# Patient Record
Sex: Male | Born: 1955 | Race: White | Hispanic: No | Marital: Single | Smoking: Current every day smoker
Health system: Southern US, Community
[De-identification: ages and names within clinical notes are randomized; demographics above are authoritative.]

---

## 2013-07-16 ENCOUNTER — Emergency Department (HOSPITAL_COMMUNITY)
Admission: EM | Admit: 2013-07-16 | Discharge: 2013-07-16 | Disposition: A | Discharge status: Home / Self Care | Payer: BC Managed Care – PPO | Attending: Emergency Medicine | Admitting: Emergency Medicine

## 2013-07-16 ENCOUNTER — Encounter (HOSPITAL_COMMUNITY): Payer: Self-pay | Admitting: Emergency Medicine

## 2013-07-16 ENCOUNTER — Emergency Department (HOSPITAL_COMMUNITY): Payer: BC Managed Care – PPO

## 2013-07-16 DIAGNOSIS — K5900 Constipation, unspecified: Secondary | ICD-10-CM | POA: Insufficient documentation

## 2013-07-16 DIAGNOSIS — R300 Dysuria: Secondary | ICD-10-CM | POA: Insufficient documentation

## 2013-07-16 DIAGNOSIS — K5792 Diverticulitis of intestine, part unspecified, without perforation or abscess without bleeding: Secondary | ICD-10-CM

## 2013-07-16 DIAGNOSIS — K5732 Diverticulitis of large intestine without perforation or abscess without bleeding: Secondary | ICD-10-CM | POA: Insufficient documentation

## 2013-07-16 DIAGNOSIS — Z79899 Other long term (current) drug therapy: Secondary | ICD-10-CM | POA: Insufficient documentation

## 2013-07-16 DIAGNOSIS — R748 Abnormal levels of other serum enzymes: Secondary | ICD-10-CM

## 2013-07-16 DIAGNOSIS — F17200 Nicotine dependence, unspecified, uncomplicated: Secondary | ICD-10-CM | POA: Insufficient documentation

## 2013-07-16 LAB — CBC WITH DIFFERENTIAL/PLATELET
BASOS PCT: 0 % (ref 0–1)
Basophils Absolute: 0 10*3/uL (ref 0.0–0.1)
Eosinophils Absolute: 0.2 10*3/uL (ref 0.0–0.7)
Eosinophils Relative: 2 % (ref 0–5)
HEMATOCRIT: 44.8 % (ref 39.0–52.0)
HEMOGLOBIN: 15.9 g/dL (ref 13.0–17.0)
Lymphocytes Relative: 25 % (ref 12–46)
Lymphs Abs: 2.1 10*3/uL (ref 0.7–4.0)
MCH: 34.1 pg — AB (ref 26.0–34.0)
MCHC: 35.5 g/dL (ref 30.0–36.0)
MCV: 96.1 fL (ref 78.0–100.0)
Monocytes Absolute: 0.8 10*3/uL (ref 0.1–1.0)
Monocytes Relative: 10 % (ref 3–12)
NEUTROS ABS: 5.3 10*3/uL (ref 1.7–7.7)
Neutrophils Relative %: 63 % (ref 43–77)
Platelets: 199 10*3/uL (ref 150–400)
RBC: 4.66 MIL/uL (ref 4.22–5.81)
RDW: 12.8 % (ref 11.5–15.5)
WBC: 8.4 10*3/uL (ref 4.0–10.5)

## 2013-07-16 LAB — COMPREHENSIVE METABOLIC PANEL
ALBUMIN: 3.3 g/dL — AB (ref 3.5–5.2)
ALT: 96 U/L — ABNORMAL HIGH (ref 0–53)
AST: 68 U/L — ABNORMAL HIGH (ref 0–37)
Alkaline Phosphatase: 79 U/L (ref 39–117)
BUN: 14 mg/dL (ref 6–23)
CO2: 25 mEq/L (ref 19–32)
Calcium: 8.8 mg/dL (ref 8.4–10.5)
Chloride: 100 mEq/L (ref 96–112)
Creatinine, Ser: 0.86 mg/dL (ref 0.50–1.35)
GFR calc Af Amer: >90 mL/min (ref >90)
Glucose, Bld: 151 mg/dL — ABNORMAL HIGH (ref 70–99)
Potassium: 4.2 mEq/L (ref 3.7–5.3)
Sodium: 135 mEq/L — ABNORMAL LOW (ref 137–147)
Total Bilirubin: 0.5 mg/dL (ref 0.3–1.2)
Total Protein: 7.3 g/dL (ref 6.0–8.3)

## 2013-07-16 LAB — URINALYSIS, ROUTINE W REFLEX MICROSCOPIC
Bilirubin Urine: NEGATIVE
GLUCOSE, UA: NEGATIVE mg/dL
Hgb urine dipstick: NEGATIVE
Ketones, ur: NEGATIVE mg/dL
LEUKOCYTES UA: NEGATIVE
NITRITE: NEGATIVE
PROTEIN: NEGATIVE mg/dL
Specific Gravity, Urine: 1.012 (ref 1.005–1.030)
UROBILINOGEN UA: 0.2 mg/dL (ref 0.0–1.0)
pH: 6.5 (ref 5.0–8.0)

## 2013-07-16 MED ORDER — IOHEXOL 300 MG/ML  SOLN
25.0000 mL | INTRAMUSCULAR | Status: AC
  Administered 2013-07-16: 25 mL via ORAL

## 2013-07-16 MED ORDER — SODIUM CHLORIDE 0.9 % IV BOLUS (SEPSIS)
1000.0000 mL | Freq: Once | INTRAVENOUS | Status: AC
  Administered 2013-07-16: 1000 mL via INTRAVENOUS

## 2013-07-16 MED ORDER — IOHEXOL 300 MG/ML  SOLN
100.0000 mL | Freq: Once | INTRAMUSCULAR | Status: AC | PRN
  Administered 2013-07-16: 100 mL via INTRAVENOUS

## 2013-07-16 MED ORDER — ONDANSETRON HCL 4 MG/2ML IJ SOLN
4.0000 mg | Freq: Once | INTRAMUSCULAR | Status: AC
  Administered 2013-07-16: 4 mg via INTRAVENOUS
  Filled 2013-07-16: qty 2

## 2013-07-16 MED ORDER — HYDROMORPHONE HCL PF 1 MG/ML IJ SOLN
1.0000 mg | Freq: Once | INTRAMUSCULAR | Status: AC
  Administered 2013-07-16: 1 mg via INTRAVENOUS
  Filled 2013-07-16: qty 1

## 2013-07-16 MED ORDER — CIPROFLOXACIN HCL 500 MG PO TABS: 500.0000 mg | ORAL_TABLET | Freq: Two times a day (BID) | ORAL | Status: AC

## 2013-07-16 MED ORDER — OXYCODONE-ACETAMINOPHEN 5-325 MG PO TABS: 1.0000 | ORAL_TABLET | ORAL | Status: AC | PRN

## 2013-07-16 MED ORDER — FLAGYL 500 MG PO TABS: 500.0000 mg | ORAL_TABLET | Freq: Three times a day (TID) | ORAL | Status: AC

## 2013-07-16 MED ORDER — MORPHINE SULFATE 4 MG/ML IJ SOLN
4.0000 mg | Freq: Once | INTRAMUSCULAR | Status: AC
  Administered 2013-07-16: 4 mg via INTRAVENOUS
  Filled 2013-07-16: qty 1

## 2013-07-16 NOTE — ED Notes (Signed)
Pt reports L sided sharp abd pain radiating across his entire abd onset 3 days ago. States he thought it was gas but he has been able to pass gas without relief of pain. States "the pain is so bad i can hardly wear pants." he reports he hasnt felt like eating or drinking. He denies n/v. He reports hes had a hard time having bowel movements but has been able to pass some stool

## 2013-07-16 NOTE — Discharge Instructions (Signed)
Your CT scan showed:  - acute diverticulitis - Cholelithiasis without inflammatory changes. - Question small lymph nodes adjacent to the distal esophagus. - L2 compression fracture is likely chronic. - Probable nodule along the central aspect of the prostate. Recommend correlation with PSA level.  Also your liver enzymes were elevated  Please follow-up with your doctor regarding these results.    - Take antibiotics for full dose  - Take Percocet for severe pain - Please be careful with this medication.  It can cause drowsiness.  Use caution while driving, operating machinery, drinking alcohol, or any other activities that may impair your physical or mental abilities.   - Return to the emergency department if you develop any changing/worsening condition, fever, worsening pain, blood in your stool, repeated vomiting, or any other concerns (please read additional information regarding your condition below) - Follow a clear liquid diet     Diverticulitis A diverticulum is a small pouch or sac on the colon. Diverticulosis is the presence of these diverticula on the colon. Diverticulitis is the irritation (inflammation) or infection of diverticula. CAUSES  The colon and its diverticula contain bacteria. If food particles block the tiny opening to a diverticulum, the bacteria inside can grow and cause an increase in pressure. This leads to infection and inflammation and is called diverticulitis. SYMPTOMS   Abdominal pain and tenderness. Usually, the pain is located on the left side of your abdomen. However, it could be located elsewhere.  Fever.  Bloating.  Feeling sick to your stomach (nausea).  Throwing up (vomiting).  Abnormal stools. DIAGNOSIS  Your caregiver will take a history and perform a physical exam. Since many things can cause abdominal pain, other tests may be necessary. Tests may include:  Blood tests.  Urine tests.  X-ray of the abdomen.  CT scan of the  abdomen. Sometimes, surgery is needed to determine if diverticulitis or other conditions are causing your symptoms. TREATMENT  Most of the time, you can be treated without surgery. Treatment includes:  Resting the bowels by only having liquids for a few days. As you improve, you will need to eat a low-fiber diet.  Intravenous (IV) fluids if you are losing body fluids (dehydrated).  Antibiotic medicines that treat infections may be given.  Pain and nausea medicine, if needed.  Surgery if the inflamed diverticulum has burst. HOME CARE INSTRUCTIONS   Try a clear liquid diet (broth, tea, or water for as long as directed by your caregiver). You may then gradually begin a low-fiber diet as tolerated.  A low-fiber diet is a diet with less than 10 grams of fiber. Choose the foods below to reduce fiber in the diet:  White breads, cereals, rice, and pasta.  Cooked fruits and vegetables or soft fresh fruits and vegetables without the skin.  Ground or well-cooked tender beef, ham, veal, lamb, pork, or poultry.  Eggs and seafood.  After your diverticulitis symptoms have improved, your caregiver may put you on a high-fiber diet. A high-fiber diet includes 14 grams of fiber for every 1000 calories consumed. For a standard 2000 calorie diet, you would need 28 grams of fiber. Follow these diet guidelines to help you increase the fiber in your diet. It is important to slowly increase the amount fiber in your diet to avoid gas, constipation, and bloating.  Choose whole-grain breads, cereals, pasta, and brown rice.  Choose fresh fruits and vegetables with the skin on. Do not overcook vegetables because the more vegetables are cooked, the more fiber  is lost.  Choose more nuts, seeds, legumes, dried peas, beans, and lentils.  Look for food products that have greater than 3 grams of fiber per serving on the Nutrition Facts label.  Take all medicine as directed by your caregiver.  If your caregiver  has given you a follow-up appointment, it is very important that you go. Not going could result in lasting (chronic) or permanent injury, pain, and disability. If there is any problem keeping the appointment, call to reschedule. SEEK MEDICAL CARE IF:   Your pain does not improve.  You have a hard time advancing your diet beyond clear liquids.  Your bowel movements do not return to normal. SEEK IMMEDIATE MEDICAL CARE IF:   Your pain becomes worse.  You have an oral temperature above 102 F (38.9 C), not controlled by medicine.  You have repeated vomiting.  You have bloody or black, tarry stools.  Symptoms that brought you to your caregiver become worse or are not getting better. MAKE SURE YOU:   Understand these instructions.  Will watch your condition.  Will get help right away if you are not doing well or get worse. Document Released: 02/06/2005 Document Revised: 07/22/2011 Document Reviewed: 06/04/2010 [facility] Patient Information 2014 [place], [place].   Diet The clear liquid diet consists of foods that are liquid or will become liquid at room temperature. Examples of foods allowed on a clear liquid diet include fruit juice, broth or bouillon, gelatin, or frozen ice pops. You should be able to see through the liquid. The purpose of this diet is to provide the necessary fluids, electrolytes (such as sodium and potassium), and energy to keep the body functioning during times when you are not able to consume a regular diet. A clear liquid diet should not be continued for long periods of time, as it is not nutritionally adequate.  A CLEAR LIQUID DIET MAY BE NEEDED:  When a sudden-onset (acute) condition occurs before or after surgery.   As the first step in oral feeding.   For fluid and electrolyte replacement in diarrheal diseases.   As a diet before certain medical tests are performed.  ADEQUACY The clear liquid diet is adequate only in ascorbic acid, according to the  Recommended Dietary Allowances of the [employer].  CHOOSING FOODS Breads and Starches  Allowed: None are allowed.   Avoid: All are to be avoided.  Vegetables  Allowed: Strained vegetable juices.   Avoid: Any others.  Fruit  Allowed: Strained fruit juices and fruit drinks. Include 1 serving of citrus or vitamin C-enriched fruit juice daily.   Avoid: Any others.  Meat and Meat Substitutes  Allowed: None are allowed.   Avoid: All are to be avoided.  Milk Products  Allowed: None are allowed.   Avoid: All are to be avoided.  Soups and Combination Foods  Allowed: Clear bouillon, broth, or strained broth-based soups.   Avoid: Any others.  Desserts and Sweets  Allowed: Sugar, honey. High-protein gelatin. Flavored gelatin, ices, or frozen ice pops that do not contain milk.   Avoid: Any others.  Fats and Oils  Allowed: None are allowed.   Avoid: All are to be avoided.  Beverages  Allowed: Cereal beverages, coffee (regular or decaffeinated), tea, or soda at the discretion of your health care provider.   Avoid: Any others.  Condiments  Allowed: Salt.   Avoid: Any others, including pepper.  Supplements  Allowed: Liquid nutrition beverages that you can see through.   Avoid: Any others that  contain lactose or fiber. SAMPLE MEAL PLAN Breakfast  4 oz (120 mL) strained orange juice.   to 1 cup (120 to 240 mL) gelatin (plain or fortified).  1 cup (240 mL) beverage (coffee or tea).  Sugar, if desired. Midmorning Snack   cup (120 mL) gelatin (plain or fortified). Lunch  1 cup (240 mL) broth or consomm.  4 oz (120 mL) strained grapefruit juice.   cup (120 mL) gelatin (plain or fortified).  1 cup (240 mL) beverage (coffee or tea).  Sugar, if desired. Midafternoon Snack   cup (120 mL) fruit ice.   cup (120 mL) strained fruit juice. Dinner  1 cup (240 mL) broth or consomm.    cup (120 mL) cranberry juice.   cup (120 mL) flavored gelatin (plain or fortified).  1 cup (240 mL) beverage (coffee or tea).  Sugar, if desired. Evening Snack  4 oz (120 mL) strained apple juice (vitamin C-fortified).   cup (120 mL) flavored gelatin (plain or fortified). MAKE SURE YOU:  Understand these instructions.  Will watch your child's condition.  Will get help right away if your child is not doing well or gets worse. Document Released: 04/29/2005 Document Revised: 12/30/2012 Document Reviewed: 09/29/2012 [facility] Patient Information 2014 [place], [place].

## 2013-07-16 NOTE — ED Notes (Signed)
Spoke with CT about delay

## 2013-07-16 NOTE — ED Notes (Signed)
CT paged. 

## 2013-07-16 NOTE — ED Provider Notes (Signed)
CSN: [id number]     Arrival date & time 07/16/13  0920 History   First MD Initiated Contact with Patient 07/16/13 1110     Chief Complaint  Patient presents with  . Abdominal Pain    HPI  Jadien Lehigh is a 58 y.o. male with no PMH who presents to the ED for evaluation of abdominal pain.  History was provided by the patient. Patient states he has had gradually worsening abdominal pain for the past 3 days. Pain is located in his LLQ with radiation towards his middle lower abdomen. His pain is constant and described as a tightening sensation. Nothing improves his pain. Movement worsens his pain. He has tried several OTC medications including Tylenol and Ibuprofen with no relief. Associated symptoms include constipation. His last BM was this morning. No rectal bleeding/pain. He also has had decreased appetite and intake. He also has had dysuria. No testicular tenderness, penile discharge/sores, or hematuria. He has had similar abdominal pain in the past when he had diverticulitis 20 years ago. No problems since then after changing his diet and lifestyle. No previous abdominal surgeries. No fever, chills, emesis, nausea, SOB, chest pain, headache, dizziness, lightheadedness, leg edema, or back pain.       History reviewed. No pertinent past medical history. History reviewed. No pertinent past surgical history. History reviewed. No pertinent family history. History  Substance Use Topics  . Smoking status: Current Every Day Smoker    Types: Cigarettes  . Smokeless tobacco: Not on file  . Alcohol Use: No    Review of Systems  Constitutional: Positive for appetite change. Negative for fever, chills, diaphoresis, activity change and fatigue.  HENT: Negative for congestion, rhinorrhea and sore throat.   Respiratory: Negative for cough and shortness of breath.   Cardiovascular: Negative for chest pain and leg swelling.  Gastrointestinal: Positive for abdominal pain and constipation. Negative for  nausea, vomiting, diarrhea, blood in stool, abdominal distention and rectal pain.  Genitourinary: Positive for dysuria. Negative for frequency, hematuria, penile swelling, scrotal swelling, difficulty urinating, genital sores, penile pain and testicular pain.  Musculoskeletal: Negative for back pain, myalgias and neck pain.  Neurological: Negative for dizziness, syncope, weakness, light-headedness and headaches.    Allergies  Review of patient's allergies indicates no known allergies.  Home Medications   Current Outpatient Rx  Name  Route  Sig  Dispense  Refill  . aspirin-acetaminophen-caffeine (EXCEDRIN MIGRAINE) 250-250-65 MG per tablet   Oral   Take 1 tablet by mouth every 6 (six) hours as needed for headache.         . Aspirin-Salicylamide-Caffeine (BC HEADACHE POWDER PO)   Oral   Take 1 packet by mouth every 6 (six) hours as needed (pain).         Marland Kitchen esomeprazole (NEXIUM) 40 MG capsule   Oral   Take 40 mg by mouth daily at 12 noon.         Marland Kitchen OVER THE COUNTER MEDICATION   Oral   Take 1 tablet by mouth 3 (three) times daily. Herbal med liver right         . ranitidine (ZANTAC) 150 MG capsule   Oral   Take 150 mg by mouth daily as needed for heartburn.          BP 128/84  Pulse 59  Temp(Src) 97.8 F (36.6 C)  Resp 20  SpO2 99%  Filed Vitals:   07/16/13 1320 07/16/13 1332 07/16/13 1512 07/16/13 1620  BP: 128/80 133/76 123/100 124/79  Pulse: 59 59 61 60  Temp: 98.5 F (36.9 C)   98.2 F (36.8 C)  TempSrc: Oral   Oral  Resp: 18     SpO2: 98% 96% 97% 99%    Physical Exam  Nursing note and vitals reviewed. Constitutional: He is oriented to person, place, and time. He appears well-developed and well-nourished. No distress.  HENT:  Head: Normocephalic and atraumatic.  Right Ear: External ear normal.  Left Ear: External ear normal.  Dry mucus membranes  Eyes: Conjunctivae are normal. Pupils are equal, round, and reactive to light. Right eye exhibits no  discharge. Left eye exhibits no discharge.  Neck: Normal range of motion. Neck supple.  Cardiovascular: Normal rate, regular rhythm, normal heart sounds and intact distal pulses.  Exam reveals no gallop and no friction rub.   No murmur heard. Dorsalis pedis pulses present and equal bilaterally  Pulmonary/Chest: Effort normal and breath sounds normal. No respiratory distress. He has no wheezes. He has no rales. He exhibits no tenderness.  Abdominal: Soft. Bowel sounds are normal. He exhibits no distension and no mass. There is tenderness. There is no rebound and no guarding.  LLQ tenderness  Musculoskeletal: Normal range of motion. He exhibits no edema and no tenderness.  Neurological: He is alert and oriented to person, place, and time.  Skin: Skin is warm and dry. He is not diaphoretic.    ED Course  Procedures (including critical care time) Labs Review Labs Reviewed  CBC WITH DIFFERENTIAL - Abnormal; Notable for the following:    MCH 34.1 (*)    All other components within normal limits  COMPREHENSIVE METABOLIC PANEL - Abnormal; Notable for the following:    Sodium 135 (*)    Glucose, Bld 151 (*)    Albumin 3.3 (*)    AST 68 (*)    ALT 96 (*)    All other components within normal limits  URINALYSIS, ROUTINE W REFLEX MICROSCOPIC   Imaging Review No results found.   EKG Interpretation None      Results for orders placed during the hospital encounter of 07/16/13  CBC WITH DIFFERENTIAL      Result Value Ref Range   WBC 8.4  4.0 - 10.5 K/uL   RBC 4.66  4.22 - 5.81 MIL/uL   Hemoglobin 15.9  13.0 - 17.0 g/dL   HCT 16.1  09.6 - 04.5 %   MCV 96.1  78.0 - 100.0 fL   MCH 34.1 (*) 26.0 - 34.0 pg   MCHC 35.5  30.0 - 36.0 g/dL   RDW 40.9  81.1 - 91.4 %   Platelets 199  150 - 400 K/uL   Neutrophils Relative % 63  43 - 77 %   Neutro Abs 5.3  1.7 - 7.7 K/uL   Lymphocytes Relative 25  12 - 46 %   Lymphs Abs 2.1  0.7 - 4.0 K/uL   Monocytes Relative 10  3 - 12 %   Monocytes  Absolute 0.8  0.1 - 1.0 K/uL   Eosinophils Relative 2  0 - 5 %   Eosinophils Absolute 0.2  0.0 - 0.7 K/uL   Basophils Relative 0  0 - 1 %   Basophils Absolute 0.0  0.0 - 0.1 K/uL  COMPREHENSIVE METABOLIC PANEL      Result Value Ref Range   Sodium 135 (*) 137 - 147 mEq/L   Potassium 4.2  3.7 - 5.3 mEq/L   Chloride 100  96 - 112 mEq/L   CO2 25  19 - 32 mEq/L   Glucose, Bld 151 (*) 70 - 99 mg/dL   BUN 14  6 - 23 mg/dL   Creatinine, Ser 1.61  0.50 - 1.35 mg/dL   Calcium 8.8  8.4 - 09.6 mg/dL   Total Protein 7.3  6.0 - 8.3 g/dL   Albumin 3.3 (*) 3.5 - 5.2 g/dL   AST 68 (*) 0 - 37 U/L   ALT 96 (*) 0 - 53 U/L   Alkaline Phosphatase 79  39 - 117 U/L   Total Bilirubin 0.5  0.3 - 1.2 mg/dL   GFR calc non Af Amer >90  >90 mL/min   GFR calc Af Amer >90  >90 mL/min  URINALYSIS, ROUTINE W REFLEX MICROSCOPIC      Result Value Ref Range   Color, Urine YELLOW  YELLOW   APPearance CLEAR  CLEAR   Specific Gravity, Urine 1.012  1.005 - 1.030   pH 6.5  5.0 - 8.0   Glucose, UA NEGATIVE  NEGATIVE mg/dL   Hgb urine dipstick NEGATIVE  NEGATIVE   Bilirubin Urine NEGATIVE  NEGATIVE   Ketones, ur NEGATIVE  NEGATIVE mg/dL   Protein, ur NEGATIVE  NEGATIVE mg/dL   Urobilinogen, UA 0.2  0.0 - 1.0 mg/dL   Nitrite NEGATIVE  NEGATIVE   Leukocytes, UA NEGATIVE  NEGATIVE    CT Abdomen Pelvis W Contrast (Final result)  Result time: 07/16/13 15:09:43    Final result by Rad Results In Interface (07/16/13 15:09:43)    Narrative:   CLINICAL DATA: Left lower quadrant pain and tenderness.  EXAM: CT ABDOMEN AND PELVIS WITH CONTRAST  TECHNIQUE: Multidetector CT imaging of the abdomen and pelvis was performed using the standard protocol following bolus administration of intravenous contrast.  CONTRAST: OMNIPAQUE IOHEXOL 300 MG/ML SOLN  COMPARISON: None.  FINDINGS: There is mild basilar atelectasis, left lung base side greater than right. No evidence for free air.  Peripherally calcified gallstone  measuring 2.4 cm. The gallbladder is not distended and no inflammatory changes around the gallbladder. Normal appearance of the liver and portal venous system. Normal appearance of the spleen, pancreas and adrenal glands. Evidence for a small hiatal hernia and there are small lymph nodes or vessels adjacent to the distal esophagus. There is a 3 cm low-density cyst in the right kidney mid pole. Otherwise, normal appearance of both kidneys. There may be a small nodule in the central aspect of the prostate.  Small amount of free fluid in the pelvis. There is diverticulosis with pericolonic inflammation around the colon at the junction of the sigmoid colon and descending colon. This is best seen on sequence 2, image 60. Findings are most compatible with acute diverticulitis. There is no evidence for an abscess collection. Normal appearance of the appendix.  There is disc space loss at L5-S1. Patient has a compression fracture involving the L2 vertebral body and suspect large Schmorl nodes along the superior endplate. There is no soft tissue swelling around the L2 vertebral body and suspect this is an old injury.  IMPRESSION: Acute inflammation in the left lower quadrant associated with the distal descending colon. Findings are compatible with acute diverticulitis. There is a small amount of free fluid in the pelvis but no evidence for an abscess collection.  Cholelithiasis without inflammatory changes.  Question small lymph nodes adjacent to the distal esophagus. Findings are nonspecific.  L2 compression fracture is likely chronic.  Probable nodule along the central aspect of the prostate. Recommend correlation with PSA level.  Electronically Signed By: Richarda OverlieAdam Henn M.D. On: 07/16/2013 15:09     MDM   Leland HerCurtis Bonello is a 58 y.o. male with no PMH who presents to the ED for evaluation of abdominal pain.   Rechecks  1:15 PM = pain increasing. 1 mg dilaudid ordered.  3:45  PM = Patient asking for discharge. Pain improved. Hungry and wants something to eat.  4:15 PM = Patient tolerating food and fluids (told to follow clear liquid diet).     Etiology of abdominal pain likely due to diverticulitis, which was seen on CT scan.  No evidence of abscess or perforation. No leukocytosis. Other incidental findings include elevated liver enzymes, prostate nodule, chronic lumbar compression fracture, cholelithiasis, and enlarged lymph nodes near the esophagus. These findings were communicated to the patient. Patient's pain controlled and able to tolerate PO liquids before discharge. Instructed to follow-up with PCP. Patient from [place] and is traveling home tomorrow. Return precautions, discharge instructions, and follow-up was discussed with the patient before discharge.     Discharge Medication List as of 07/16/2013  4:14 PM    START taking these medications   Details  ciprofloxacin (CIPRO) 500 MG tablet Take 1 tablet (500 mg total) by mouth every 12 (twelve) hours., Starting 07/16/2013, Until Discontinued, Print    FLAGYL 500 MG tablet Take 1 tablet (500 mg total) by mouth 3 (three) times daily., Starting 07/16/2013, Until Discontinued, Print    oxyCODONE-acetaminophen (PERCOCET/ROXICET) 5-325 MG per tablet Take 1-2 tablets by mouth every 4 (four) hours as needed for severe pain., Starting 07/16/2013, Until Discontinued, Print         Final impressions: 1. Diverticulitis   2. Elevated liver enzymes      Luiz IronJessica Katlin Ammarie Matsuura PA-C   This patient was discussed with Dr. Mingo AmberPlunkett          Sai Zinn K Prince Olivier, PA-C 07/17/13 1149

## 2013-07-16 NOTE — ED Notes (Signed)
Patient transported to CT 

## 2013-07-16 NOTE — ED Notes (Signed)
PA at bedside.

## 2013-07-16 NOTE — ED Notes (Signed)
Spoke with CT about pt transport- pt updated on delay.

## 2013-07-18 NOTE — ED Provider Notes (Signed)
Medical screening examination/treatment/procedure(s) were performed by non-physician practitioner and as supervising physician I was immediately available for consultation/collaboration.   EKG Interpretation None        Gwyneth SproutWhitney Kishia Shackett, MD 07/18/13 [phone]

## 2014-11-22 IMAGING — CT CT ABD-PELV W/ CM
2 of 5 series · 15 of 46 positions shown, 17 images · IV contrast (APPLIED)
Comparison: None.

CLINICAL DATA: Left lower quadrant pain and tenderness.

EXAM:
CT ABDOMEN AND PELVIS WITH CONTRAST
TECHNIQUE: Multidetector CT imaging of the abdomen and pelvis was performed
using the standard protocol following bolus administration of
intravenous contrast.
CONTRAST:  100mL OMNIPAQUE IOHEXOL 300 MG/ML  SOLN

[Series 2: abd/ pelvis 5.0 i30f 1 · axial · 0.76mm/px · z∈[-1031,-596]mm · 12 of 99 slices shown, 14 images]
[im 6/99  soft-tissue]
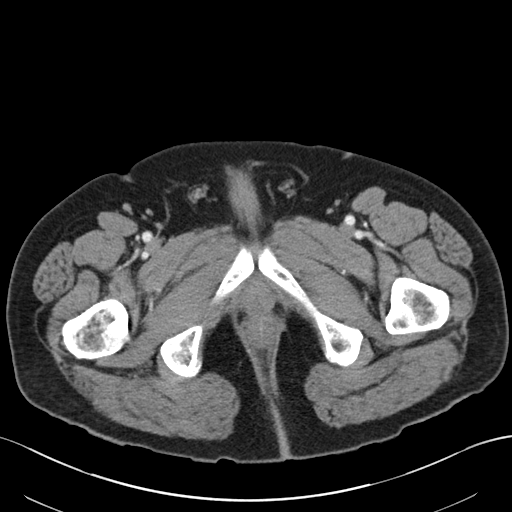
[im 6/99  bone]
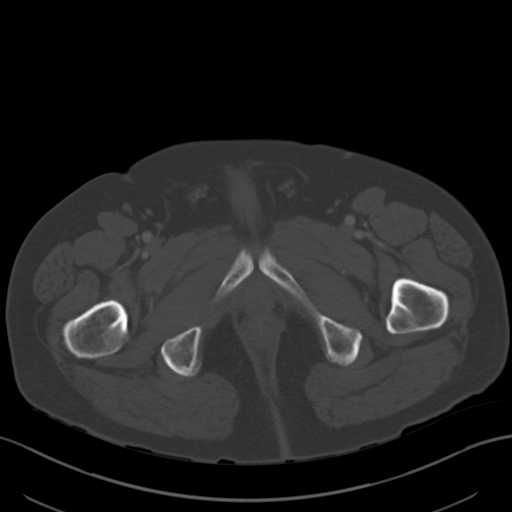
[im 16/99  soft-tissue]
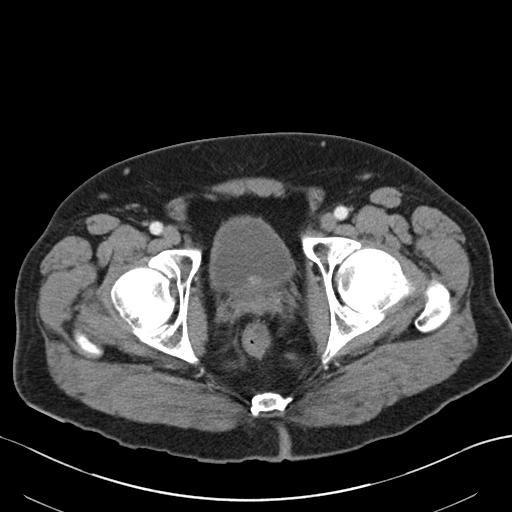
[im 21/99  soft-tissue]
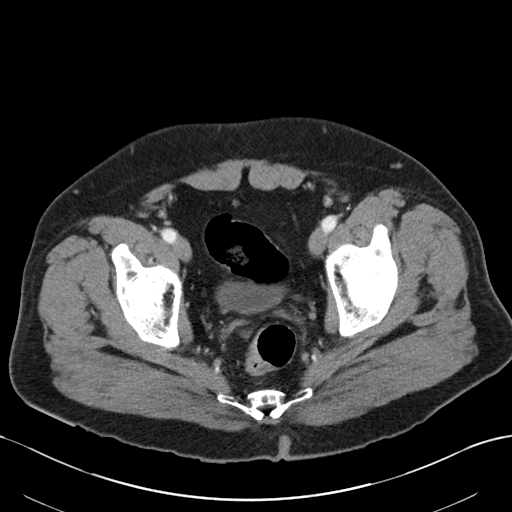
[im 31/99  soft-tissue]
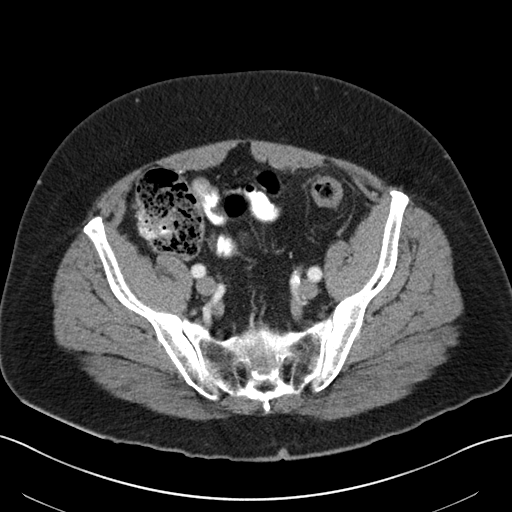
[im 37/99  soft-tissue]
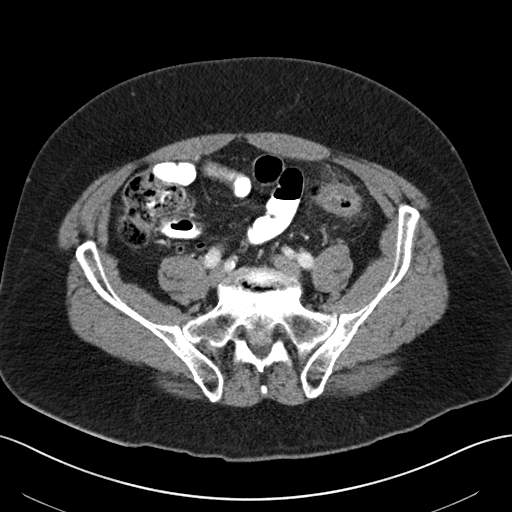
[im 47/99  soft-tissue]
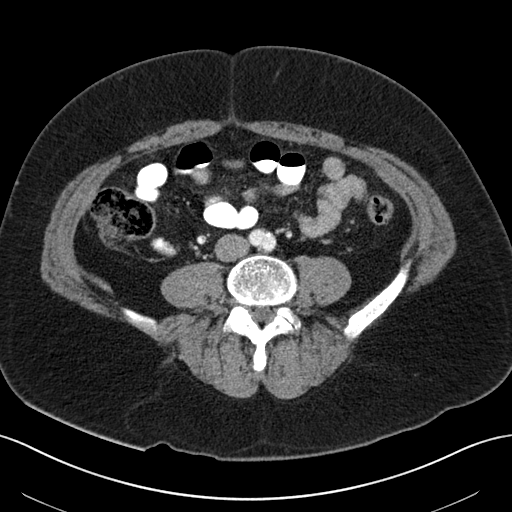
[im 52/99  soft-tissue]
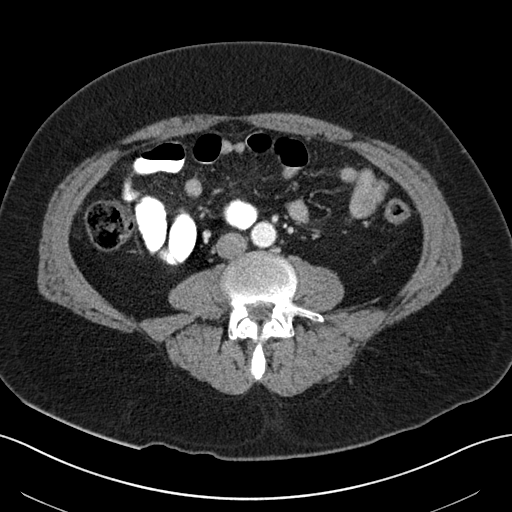
[im 62/99  soft-tissue]
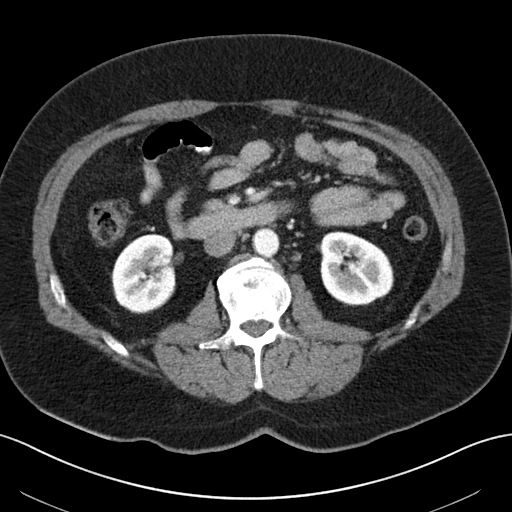
[im 68/99  soft-tissue]
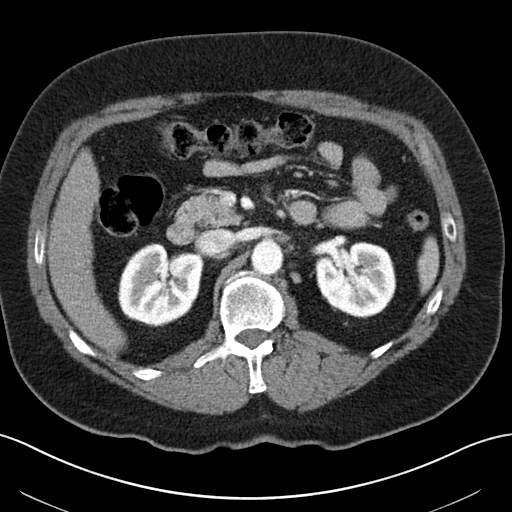
[im 68/99  bone]
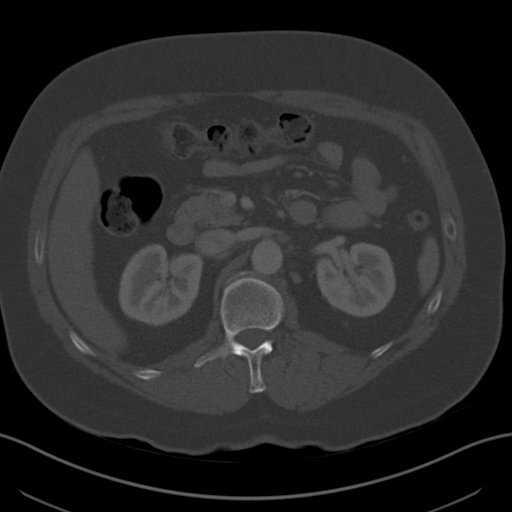
[im 78/99  soft-tissue]
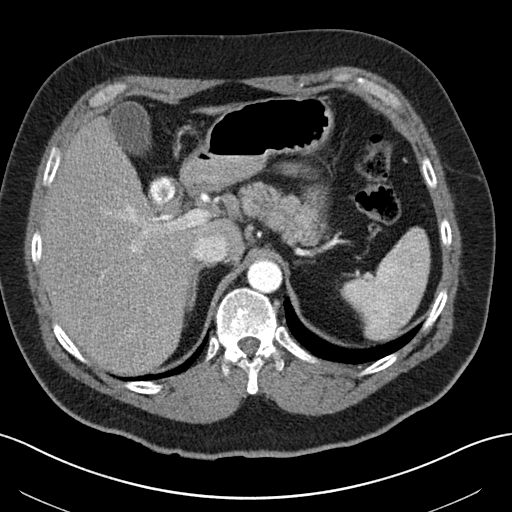
[im 83/99  soft-tissue]
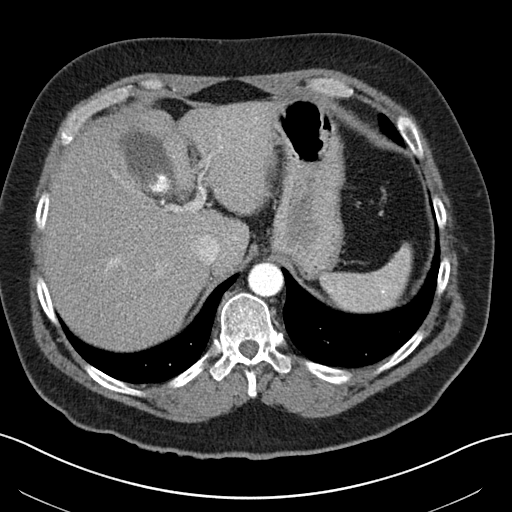
[im 93/99  soft-tissue]
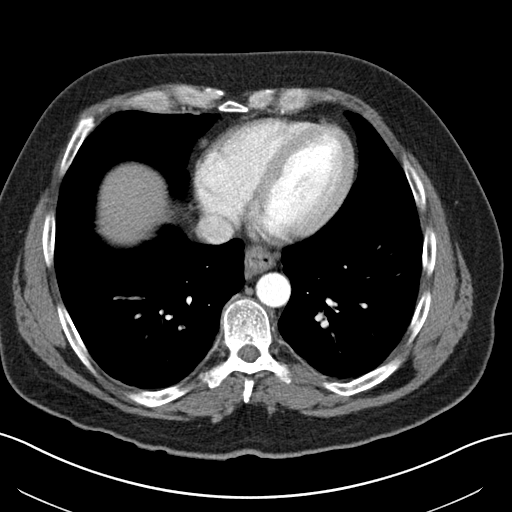

[Series 5: coronals · coronal · 0.70mm/px · 3 of 129 slices shown]
[im 43/129  soft-tissue]
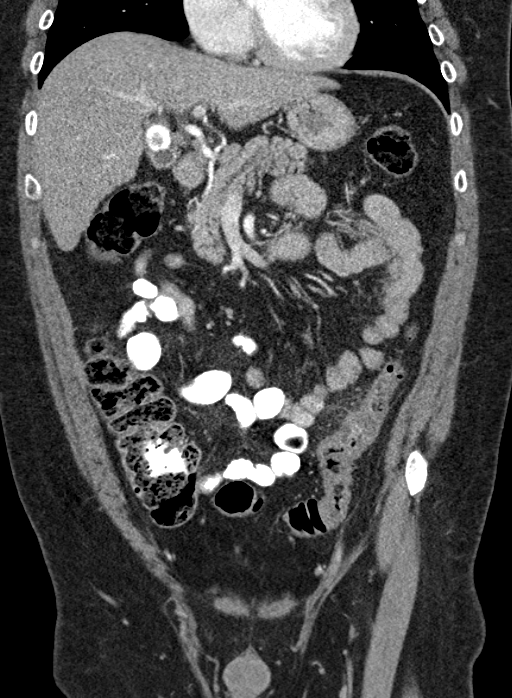
[im 57/129  soft-tissue]
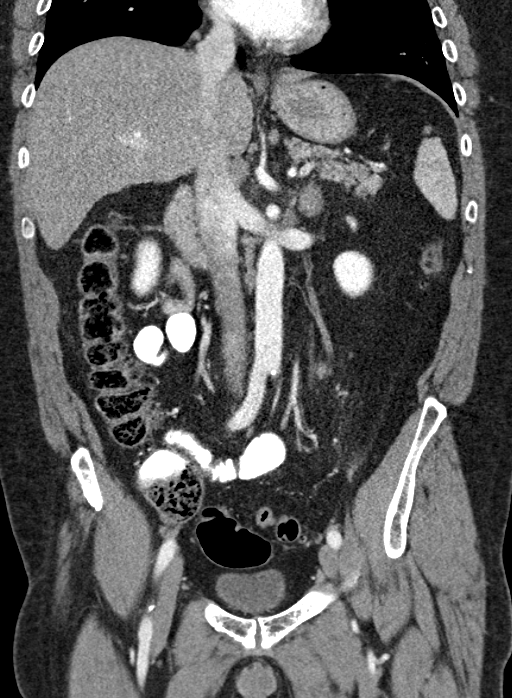
[im 72/129  soft-tissue]
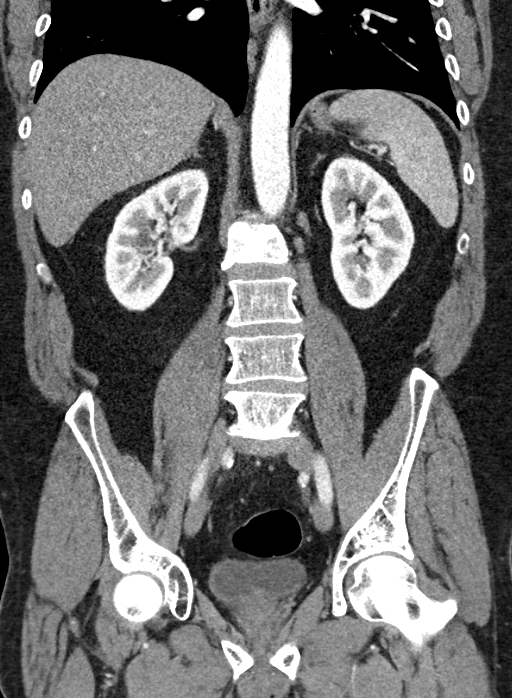

[15 of 46 positions shown; findings below may reference images not displayed]

FINDINGS: There is mild basilar atelectasis, left lung base side greater than
right. No evidence for free air.

Peripherally calcified gallstone measuring 2.4 cm. The gallbladder
is not distended and no inflammatory changes around the gallbladder.
Normal appearance of the liver and portal venous system. Normal
appearance of the spleen, pancreas and adrenal glands. Evidence for
a small hiatal hernia and there are small lymph nodes or vessels
adjacent to the distal esophagus. There is a 3 cm low-density cyst
in the right kidney mid pole. Otherwise, normal appearance of both
kidneys. There may be a small nodule in the central aspect of the
prostate.

Small amount of free fluid in the pelvis. There is diverticulosis
with pericolonic inflammation around the colon at the junction of
the sigmoid colon and descending colon. This is best seen on
sequence 2, image 60. Findings are most compatible with acute
diverticulitis. There is no evidence for an abscess collection.
Normal appearance of the appendix.

There is disc space loss at L5-S1. Patient has a compression
fracture involving the L2 vertebral body and suspect large Schmorl
nodes along the superior endplate. There is no soft tissue swelling
around the L2 vertebral body and suspect this is an old injury.
IMPRESSION: Acute inflammation in the left lower quadrant associated with the
distal descending colon. Findings are compatible with acute
diverticulitis. There is a small amount of free fluid in the pelvis
but no evidence for an abscess collection.

Cholelithiasis without inflammatory changes.

Question small lymph nodes adjacent to the distal esophagus.
Findings are nonspecific.

L2 compression fracture is likely chronic.

Probable nodule along the central aspect of the prostate. Recommend
correlation with PSA level.
